# Patient Record
Sex: Male | Born: 1945 | Race: Black or African American | Hispanic: No | Marital: Married
Health system: Southern US, Community
[De-identification: ages and names within clinical notes are randomized; demographics above are authoritative.]

---

## 2009-01-20 ENCOUNTER — Encounter (INDEPENDENT_AMBULATORY_CARE_PROVIDER_SITE_OTHER): Payer: Self-pay | Admitting: Specialist

## 2009-01-20 ENCOUNTER — Ambulatory Visit (HOSPITAL_BASED_OUTPATIENT_CLINIC_OR_DEPARTMENT_OTHER): Admission: RE | Admit: 2009-01-20 | Discharge: 2009-01-20 | Payer: Self-pay | Admitting: Specialist

## 2009-07-14 ENCOUNTER — Ambulatory Visit (HOSPITAL_COMMUNITY): Admission: RE | Admit: 2009-07-14 | Discharge: 2009-07-14 | Payer: Self-pay | Admitting: Gastroenterology

## 2010-11-20 LAB — POCT HEMOGLOBIN-HEMACUE: Hemoglobin: 12.6 g/dL — ABNORMAL LOW (ref 13.0–17.0)

## 2010-12-26 NOTE — Op Note (Signed)
NAMEJERRET, Patrick Mann            ACCOUNT NO.:  [id number]   MEDICAL RECORD NO.:  [id number]          PATIENT TYPE:  AMB   LOCATION:  [facility]                         FACILITY:  [facility]   PHYSICIAN:  Jene Every, M.D.    DATE OF BIRTH:  05/07/1946   DATE OF PROCEDURE:  01/20/2009  DATE OF DISCHARGE:                               OPERATIVE REPORT   PREOPERATIVE DIAGNOSES:  Foreign body, right anterior knee.   POSTOPERATIVE DIAGNOSES:  Foreign body, right anterior knee,  hypertrophic bursa.   PROCEDURE PERFORMED:  1. Removal of foreign body, right knee.  2. Excision of prepatellar bursa.   ANESTHESIA:  General.   ASSISTANT:  None.   BRIEF HISTORY:  This 65 year old with retained foreign body of the right  knee, after falling on a mirror.  Had  pain, swelling, inability to  kneel.  X-rays indicated a foreign body in the pre-patellar area.  Is  indicated for excision.  Risks and benefits were discussed including  bleeding, infection, etc.   DESCRIPTION OF PROCEDURE:  The patient in the supine position, after  adequate general anesthesia and 1 gram of  Kefzol, the left lower  extremity was prepped and draped in the usual sterile fashion.  Under C-  arm x-ray, we were unable to see the foreign body on the mini C-arm..  It was palpable in the subcutaneous tissue anteriorly.  Incised the skin  longitudinally approximately 1.5 cm full-thickness, excising a small  lesion anteriorly that was identified as a probable area of the  subcutaneous foreign body.  Noted also there was a very hypertrophic  bursa that was incorporating this expected foreign body in the entire  prepatellar area of the knee.  I therefore meticulously performed a  bursectomy of the prepatellar bursa medial, lateral, cephalad and caudad  as an entire specimen.  It was then removed onto the back table and then  placed onto the mini C-arm and x-rayed,  and the foreign body was noted  to be within the confines of the  hypertrophic bursa.  Following this,  the wound was copiously irrigated, injected with 0.25% Marcaine with  epinephrine, and closed with #4-0 nylon simple sutures.  Radiographs  demonstrated no evidence of foreign body after that.  Exam under  anesthesia was unremarkable as well.   Next, the wound was dressed sterilely and secured with an Ace bandage.  There was no tourniquet. The patient was extubated without difficulty  and transported to recovery in satisfactory condition.   The patient tolerated the procedure well.  There were no complications.  No assistant.     Jene Every, M.D.  Electronically Signed    JB/MEDQ  D:  01/20/2009  T:  01/20/2009  Job:  [id number]

## 2019-09-04 ENCOUNTER — Ambulatory Visit: Payer: Self-pay | Attending: Internal Medicine

## 2019-09-04 DIAGNOSIS — Z23 Encounter for immunization: Secondary | ICD-10-CM | POA: Insufficient documentation

## 2019-09-04 NOTE — Progress Notes (Signed)
   Covid-19 Vaccination Clinic  Name:  Patrick Mann    MRN: [id number] DOB: 18-Dec-1945  09/04/2019  [name] was observed post Covid-19 immunization for 15 minutes without incidence. He was provided with Vaccine Information Sheet and instruction to access the V-Safe system.   [name] was instructed to call 911 with any severe reactions post vaccine: Marland Kitchen Difficulty breathing  . Swelling of your face and throat  . A fast heartbeat  . A bad rash all over your body  . Dizziness and weakness    Immunizations Administered    Name Date Dose VIS Date Route   Pfizer COVID-19 Vaccine 09/04/2019  6:04 PM 0.3 mL 07/24/2019 Intramuscular   Manufacturer: [employer], [employer]   Lot: [id number]   NDC: [address]-5

## 2019-09-22 ENCOUNTER — Ambulatory Visit: Payer: Self-pay | Attending: Internal Medicine

## 2019-09-22 DIAGNOSIS — Z23 Encounter for immunization: Secondary | ICD-10-CM | POA: Insufficient documentation

## 2019-09-22 NOTE — Progress Notes (Signed)
   Covid-19 Vaccination Clinic  Name:  Patrick Mann    MRN: [id number] DOB: 10/16/1945  09/22/2019  Mr. Rule was observed post Covid-19 immunization for 15 minutes without incidence. He was provided with Vaccine Information Sheet and instruction to access the V-Safe system.   [name] was instructed to call 911 with any severe reactions post vaccine: Marland Kitchen Difficulty breathing  . Swelling of your face and throat  . A fast heartbeat  . A bad rash all over your body  . Dizziness and weakness    Immunizations Administered    Name Date Dose VIS Date Route   Pfizer COVID-19 Vaccine 09/22/2019  9:20 AM 0.3 mL 07/24/2019 Intramuscular   Manufacturer: [employer], [employer]   Lot: [id number]   NDC: [address]-0

## 2019-10-07 ENCOUNTER — Ambulatory Visit: Payer: Self-pay

## 2020-10-18 DIAGNOSIS — M25551 Pain in right hip: Secondary | ICD-10-CM | POA: Diagnosis not present

## 2020-11-01 DIAGNOSIS — M79604 Pain in right leg: Secondary | ICD-10-CM | POA: Diagnosis not present

## 2020-11-09 ENCOUNTER — Other Ambulatory Visit: Payer: Self-pay | Admitting: Internal Medicine

## 2020-11-09 DIAGNOSIS — M79604 Pain in right leg: Secondary | ICD-10-CM

## 2020-11-11 ENCOUNTER — Other Ambulatory Visit: Payer: Self-pay

## 2020-11-11 ENCOUNTER — Ambulatory Visit
Admission: RE | Admit: 2020-11-11 | Discharge: 2020-11-11 | Disposition: A | Discharge status: Home / Self Care | Payer: Medicare Other | Source: Ambulatory Visit | Attending: Internal Medicine | Admitting: Internal Medicine

## 2020-11-11 DIAGNOSIS — M48061 Spinal stenosis, lumbar region without neurogenic claudication: Secondary | ICD-10-CM | POA: Diagnosis not present

## 2020-11-11 DIAGNOSIS — M545 Low back pain, unspecified: Secondary | ICD-10-CM | POA: Diagnosis not present

## 2020-11-11 DIAGNOSIS — M79604 Pain in right leg: Secondary | ICD-10-CM

## 2020-11-21 DIAGNOSIS — M48061 Spinal stenosis, lumbar region without neurogenic claudication: Secondary | ICD-10-CM | POA: Diagnosis not present

## 2020-11-21 DIAGNOSIS — M5416 Radiculopathy, lumbar region: Secondary | ICD-10-CM | POA: Diagnosis not present

## 2020-12-08 DIAGNOSIS — M5431 Sciatica, right side: Secondary | ICD-10-CM | POA: Diagnosis not present

## 2020-12-08 DIAGNOSIS — S91332A Puncture wound without foreign body, left foot, initial encounter: Secondary | ICD-10-CM | POA: Diagnosis not present

## 2020-12-08 DIAGNOSIS — E1169 Type 2 diabetes mellitus with other specified complication: Secondary | ICD-10-CM | POA: Diagnosis not present

## 2020-12-09 DIAGNOSIS — L03116 Cellulitis of left lower limb: Secondary | ICD-10-CM | POA: Diagnosis not present

## 2020-12-09 DIAGNOSIS — W450XXA Nail entering through skin, initial encounter: Secondary | ICD-10-CM | POA: Diagnosis not present

## 2020-12-09 DIAGNOSIS — S91332A Puncture wound without foreign body, left foot, initial encounter: Secondary | ICD-10-CM | POA: Diagnosis not present

## 2020-12-19 DIAGNOSIS — S91332D Puncture wound without foreign body, left foot, subsequent encounter: Secondary | ICD-10-CM | POA: Diagnosis not present

## 2020-12-22 DIAGNOSIS — M5416 Radiculopathy, lumbar region: Secondary | ICD-10-CM | POA: Diagnosis not present

## 2021-01-18 DIAGNOSIS — M48061 Spinal stenosis, lumbar region without neurogenic claudication: Secondary | ICD-10-CM | POA: Diagnosis not present

## 2021-01-18 DIAGNOSIS — M5416 Radiculopathy, lumbar region: Secondary | ICD-10-CM | POA: Diagnosis not present

## 2021-02-20 DIAGNOSIS — Z1211 Encounter for screening for malignant neoplasm of colon: Secondary | ICD-10-CM | POA: Diagnosis not present

## 2021-02-23 DIAGNOSIS — M5416 Radiculopathy, lumbar region: Secondary | ICD-10-CM | POA: Diagnosis not present

## 2021-03-06 DIAGNOSIS — M48061 Spinal stenosis, lumbar region without neurogenic claudication: Secondary | ICD-10-CM | POA: Diagnosis not present

## 2021-03-08 DIAGNOSIS — E1169 Type 2 diabetes mellitus with other specified complication: Secondary | ICD-10-CM | POA: Diagnosis not present

## 2021-03-17 DIAGNOSIS — M5126 Other intervertebral disc displacement, lumbar region: Secondary | ICD-10-CM | POA: Diagnosis not present

## 2021-05-31 DIAGNOSIS — Z23 Encounter for immunization: Secondary | ICD-10-CM | POA: Diagnosis not present

## 2021-06-05 DIAGNOSIS — E782 Mixed hyperlipidemia: Secondary | ICD-10-CM | POA: Diagnosis not present

## 2021-06-05 DIAGNOSIS — Z0000 Encounter for general adult medical examination without abnormal findings: Secondary | ICD-10-CM | POA: Diagnosis not present

## 2021-06-05 DIAGNOSIS — R030 Elevated blood-pressure reading, without diagnosis of hypertension: Secondary | ICD-10-CM | POA: Diagnosis not present

## 2021-06-05 DIAGNOSIS — Z23 Encounter for immunization: Secondary | ICD-10-CM | POA: Diagnosis not present

## 2021-06-05 DIAGNOSIS — M5136 Other intervertebral disc degeneration, lumbar region: Secondary | ICD-10-CM | POA: Diagnosis not present

## 2021-06-05 DIAGNOSIS — E1169 Type 2 diabetes mellitus with other specified complication: Secondary | ICD-10-CM | POA: Diagnosis not present

## 2021-07-03 DIAGNOSIS — E782 Mixed hyperlipidemia: Secondary | ICD-10-CM | POA: Diagnosis not present

## 2021-07-03 DIAGNOSIS — R030 Elevated blood-pressure reading, without diagnosis of hypertension: Secondary | ICD-10-CM | POA: Diagnosis not present

## 2021-07-03 DIAGNOSIS — E1169 Type 2 diabetes mellitus with other specified complication: Secondary | ICD-10-CM | POA: Diagnosis not present

## 2021-07-25 DIAGNOSIS — H4323 Crystalline deposits in vitreous body, bilateral: Secondary | ICD-10-CM | POA: Diagnosis not present

## 2021-07-25 DIAGNOSIS — H401131 Primary open-angle glaucoma, bilateral, mild stage: Secondary | ICD-10-CM | POA: Diagnosis not present

## 2021-07-25 DIAGNOSIS — H35033 Hypertensive retinopathy, bilateral: Secondary | ICD-10-CM | POA: Diagnosis not present

## 2021-08-01 DIAGNOSIS — E782 Mixed hyperlipidemia: Secondary | ICD-10-CM | POA: Diagnosis not present

## 2021-08-01 DIAGNOSIS — I10 Essential (primary) hypertension: Secondary | ICD-10-CM | POA: Diagnosis not present

## 2021-08-25 ENCOUNTER — Other Ambulatory Visit (HOSPITAL_COMMUNITY): Payer: Self-pay

## 2021-08-25 MED ORDER — VYZULTA 0.024 % OP SOLN
OPHTHALMIC | 3 refills | Status: AC
  Filled 2021-08-25: qty 5, 25d supply, fill #0
  Filled 2021-11-21: qty 5, 25d supply, fill #1
  Filled 2022-02-21: qty 5, 25d supply, fill #2
  Filled 2022-05-28: qty 5, 25d supply, fill #3

## 2021-08-26 ENCOUNTER — Other Ambulatory Visit (HOSPITAL_COMMUNITY): Payer: Self-pay

## 2021-08-28 ENCOUNTER — Other Ambulatory Visit (HOSPITAL_COMMUNITY): Payer: Self-pay

## 2021-08-31 ENCOUNTER — Other Ambulatory Visit (HOSPITAL_COMMUNITY): Payer: Self-pay

## 2021-11-21 ENCOUNTER — Other Ambulatory Visit (HOSPITAL_COMMUNITY): Payer: Self-pay

## 2021-11-22 ENCOUNTER — Other Ambulatory Visit (HOSPITAL_COMMUNITY): Payer: Self-pay

## 2021-12-20 DIAGNOSIS — E1169 Type 2 diabetes mellitus with other specified complication: Secondary | ICD-10-CM | POA: Diagnosis not present

## 2021-12-20 DIAGNOSIS — I10 Essential (primary) hypertension: Secondary | ICD-10-CM | POA: Diagnosis not present

## 2021-12-20 DIAGNOSIS — E782 Mixed hyperlipidemia: Secondary | ICD-10-CM | POA: Diagnosis not present

## 2022-02-21 ENCOUNTER — Other Ambulatory Visit (HOSPITAL_COMMUNITY): Payer: Self-pay

## 2022-02-22 ENCOUNTER — Other Ambulatory Visit (HOSPITAL_COMMUNITY): Payer: Self-pay

## 2022-05-28 ENCOUNTER — Other Ambulatory Visit (HOSPITAL_COMMUNITY): Payer: Self-pay

## 2022-05-29 ENCOUNTER — Other Ambulatory Visit (HOSPITAL_COMMUNITY): Payer: Self-pay

## 2022-06-06 DIAGNOSIS — M5136 Other intervertebral disc degeneration, lumbar region: Secondary | ICD-10-CM | POA: Diagnosis not present

## 2022-06-06 DIAGNOSIS — R030 Elevated blood-pressure reading, without diagnosis of hypertension: Secondary | ICD-10-CM | POA: Diagnosis not present

## 2022-06-06 DIAGNOSIS — M7710 Lateral epicondylitis, unspecified elbow: Secondary | ICD-10-CM | POA: Diagnosis not present

## 2022-06-06 DIAGNOSIS — Z0000 Encounter for general adult medical examination without abnormal findings: Secondary | ICD-10-CM | POA: Diagnosis not present

## 2022-06-06 DIAGNOSIS — Z1211 Encounter for screening for malignant neoplasm of colon: Secondary | ICD-10-CM | POA: Diagnosis not present

## 2022-06-06 DIAGNOSIS — Z23 Encounter for immunization: Secondary | ICD-10-CM | POA: Diagnosis not present

## 2022-06-06 DIAGNOSIS — E782 Mixed hyperlipidemia: Secondary | ICD-10-CM | POA: Diagnosis not present

## 2022-06-06 DIAGNOSIS — Z79899 Other long term (current) drug therapy: Secondary | ICD-10-CM | POA: Diagnosis not present

## 2022-06-06 DIAGNOSIS — E1169 Type 2 diabetes mellitus with other specified complication: Secondary | ICD-10-CM | POA: Diagnosis not present

## 2022-07-23 DIAGNOSIS — Z23 Encounter for immunization: Secondary | ICD-10-CM | POA: Diagnosis not present

## 2022-09-26 ENCOUNTER — Other Ambulatory Visit (HOSPITAL_COMMUNITY): Payer: Self-pay

## 2022-10-03 ENCOUNTER — Other Ambulatory Visit (HOSPITAL_COMMUNITY): Payer: Self-pay

## 2022-10-11 DIAGNOSIS — Z1211 Encounter for screening for malignant neoplasm of colon: Secondary | ICD-10-CM | POA: Diagnosis not present

## 2022-10-11 DIAGNOSIS — D120 Benign neoplasm of cecum: Secondary | ICD-10-CM | POA: Diagnosis not present

## 2022-10-11 DIAGNOSIS — K5730 Diverticulosis of large intestine without perforation or abscess without bleeding: Secondary | ICD-10-CM | POA: Diagnosis not present

## 2022-10-11 DIAGNOSIS — K648 Other hemorrhoids: Secondary | ICD-10-CM | POA: Diagnosis not present

## 2022-10-11 DIAGNOSIS — D125 Benign neoplasm of sigmoid colon: Secondary | ICD-10-CM | POA: Diagnosis not present

## 2022-10-11 DIAGNOSIS — D122 Benign neoplasm of ascending colon: Secondary | ICD-10-CM | POA: Diagnosis not present

## 2022-10-13 ENCOUNTER — Other Ambulatory Visit (HOSPITAL_COMMUNITY): Payer: Self-pay

## 2022-10-15 ENCOUNTER — Other Ambulatory Visit (HOSPITAL_COMMUNITY): Payer: Self-pay

## 2022-10-15 DIAGNOSIS — D125 Benign neoplasm of sigmoid colon: Secondary | ICD-10-CM | POA: Diagnosis not present

## 2022-10-15 DIAGNOSIS — D120 Benign neoplasm of cecum: Secondary | ICD-10-CM | POA: Diagnosis not present

## 2022-10-15 DIAGNOSIS — D122 Benign neoplasm of ascending colon: Secondary | ICD-10-CM | POA: Diagnosis not present

## 2022-10-15 MED ORDER — VYZULTA 0.024 % OP SOLN
OPHTHALMIC | 4 refills | Status: AC
  Filled 2022-10-15: qty 5, 50d supply, fill #0

## 2022-10-16 ENCOUNTER — Other Ambulatory Visit (HOSPITAL_COMMUNITY): Payer: Self-pay

## 2022-10-18 ENCOUNTER — Other Ambulatory Visit (HOSPITAL_COMMUNITY): Payer: Self-pay

## 2022-10-18 DIAGNOSIS — H401131 Primary open-angle glaucoma, bilateral, mild stage: Secondary | ICD-10-CM | POA: Diagnosis not present

## 2022-10-18 MED ORDER — VYZULTA 0.024 % OP SOLN
OPHTHALMIC | 4 refills | Status: AC
  Filled 2022-10-18: qty 5, 50d supply, fill #0
  Filled 2022-10-25: qty 2.5, 25d supply, fill #0

## 2022-10-25 ENCOUNTER — Other Ambulatory Visit (HOSPITAL_COMMUNITY): Payer: Self-pay

## 2022-10-26 ENCOUNTER — Other Ambulatory Visit (HOSPITAL_COMMUNITY): Payer: Self-pay

## 2022-11-03 IMAGING — MR MR LUMBAR SPINE W/O CM
4 series · 29 of 48 positions shown · non-contrast
Comparison: None.

CLINICAL DATA: Right thigh/leg pain, right leg numbness

EXAM:
MRI LUMBAR SPINE WITHOUT CONTRAST
TECHNIQUE: Multiplanar, multisequence MR imaging of the lumbar spine was
performed. No intravenous contrast was administered.

[Series 2: T2 · sagittal · 4.0mm · 0.53mm/px · 8 of 16 slices shown (1 of 2)]
[im 1/16]
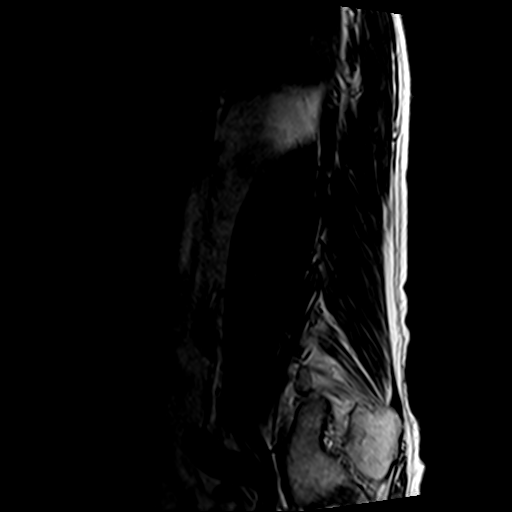
[im 3/16]
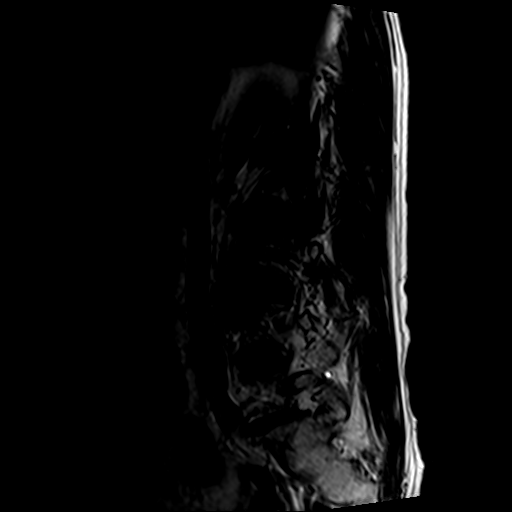
[im 5/16]
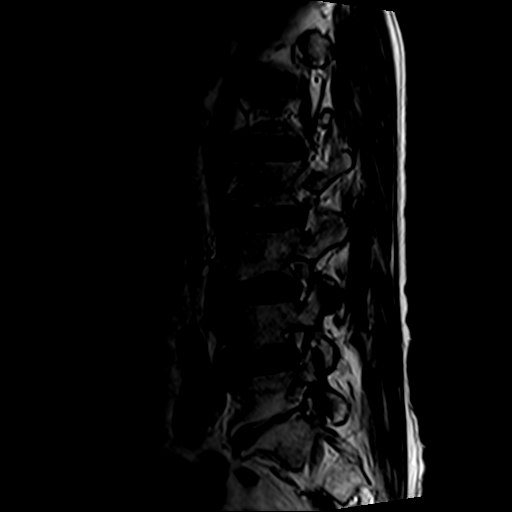
[im 7/16]
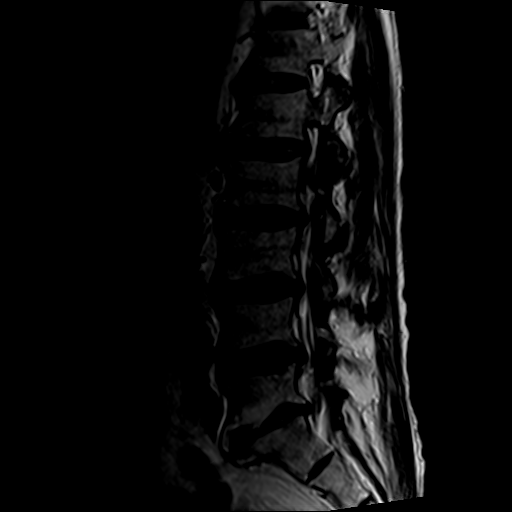
[im 9/16]
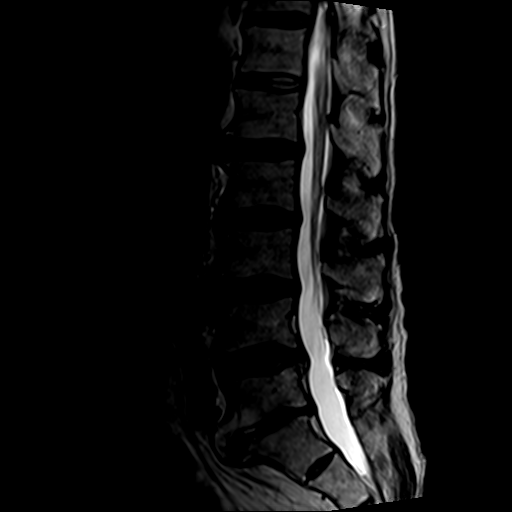
[im 11/16]
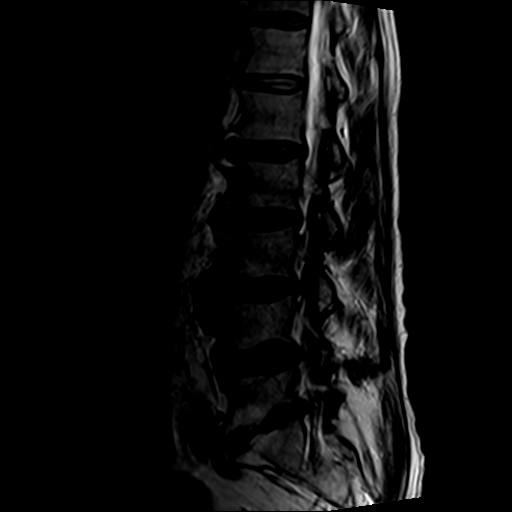
[im 13/16]
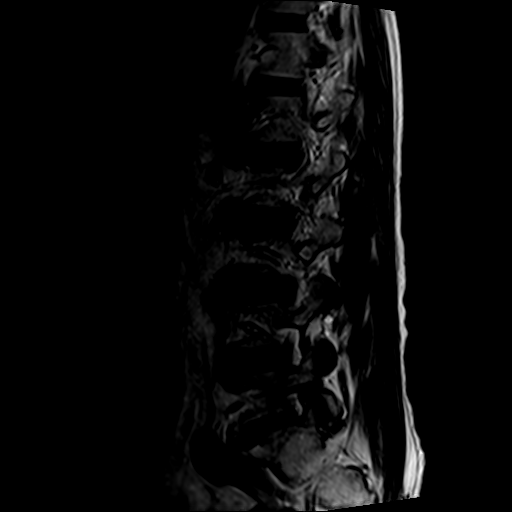
[im 16/16]
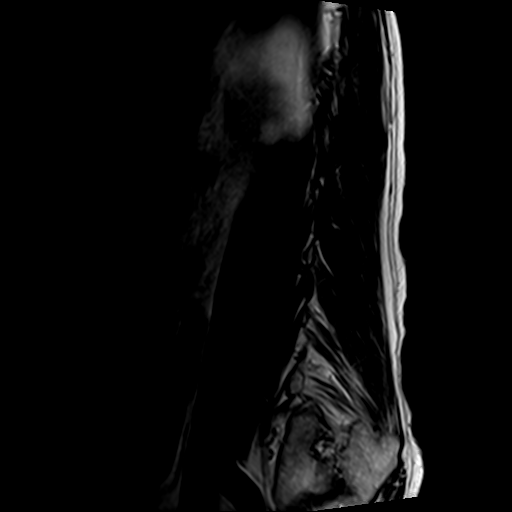

[Series 3: STIR · sagittal · 4.0mm · 1.05mm/px · 3 of 16 slices shown]
[im 2/16]
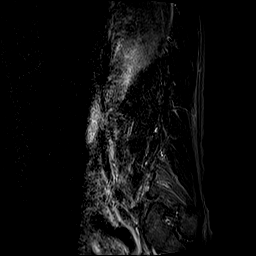
[im 8/16]
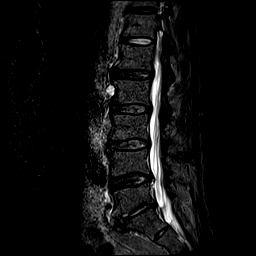
[im 14/16]
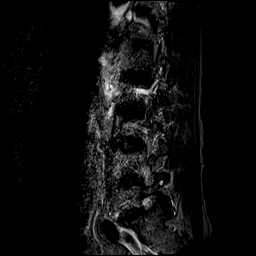

[Series 4: T1 · sagittal · 4.0mm · 0.53mm/px · 7 of 16 slices shown]
[im 1/16]
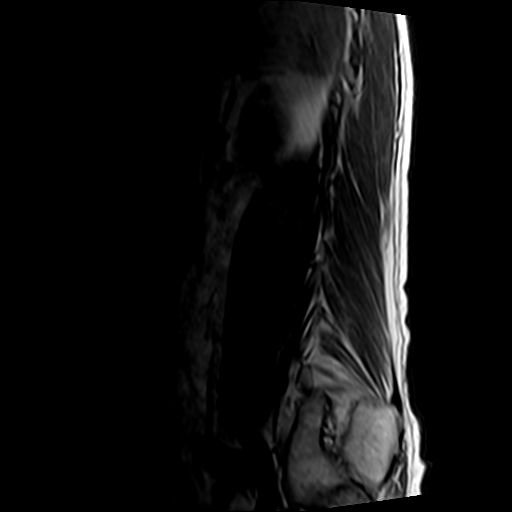
[im 2/16]
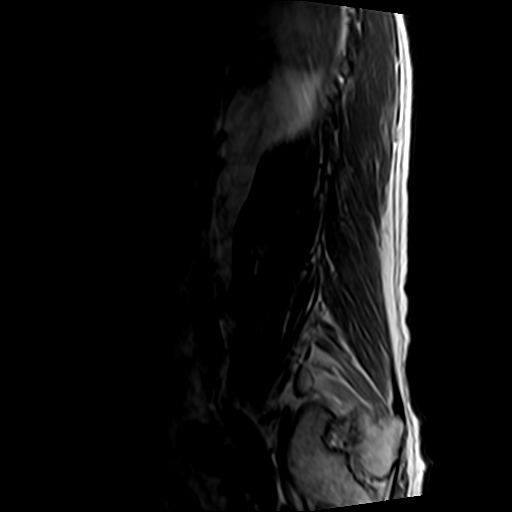
[im 4/16]
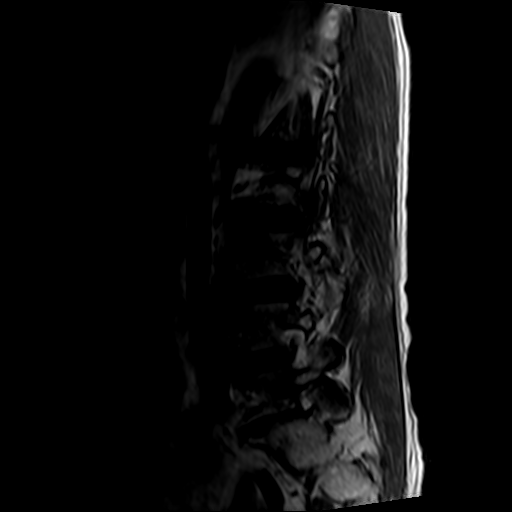
[im 6/16]
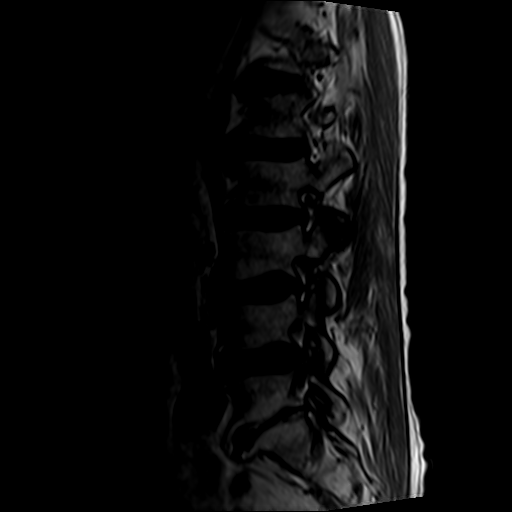
[im 8/16]
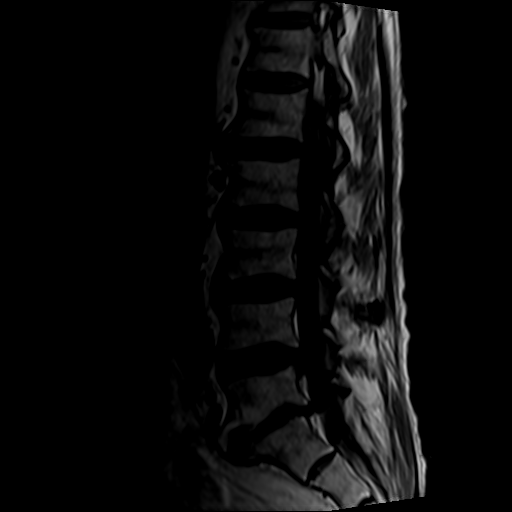
[im 10/16]
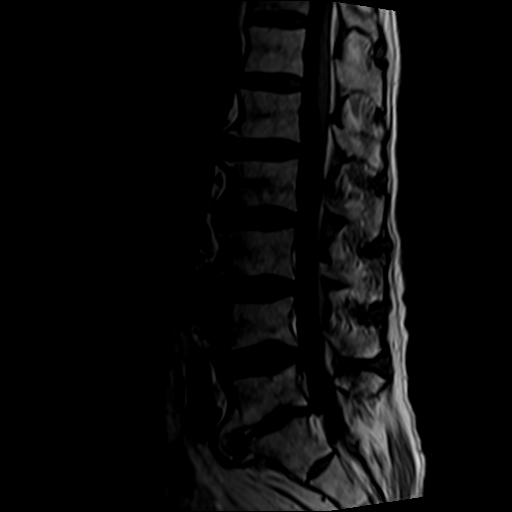
[im 14/16]
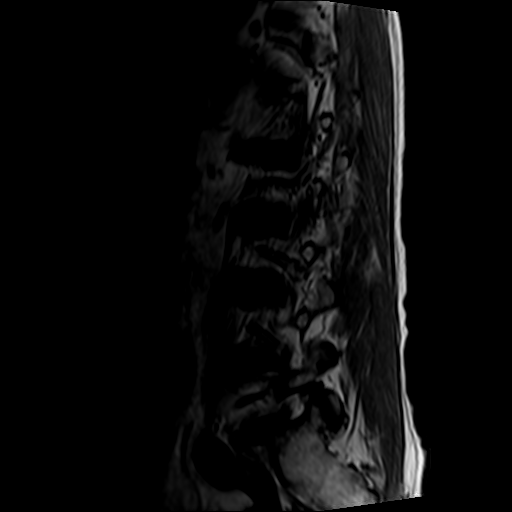

[Series 5: T2 · axial · 4.0mm · 0.70mm/px · z∈[-95,+104]mm · 11 of 41 slices shown (2 of 2)]
[im 2/41]
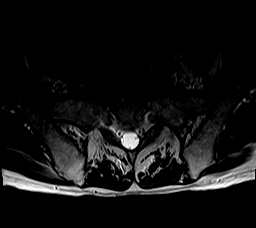
[im 6/41]
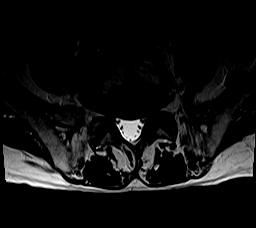
[im 8/41]
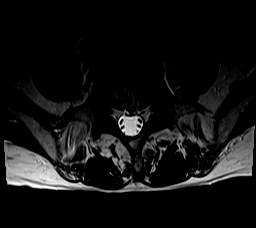
[im 12/41]
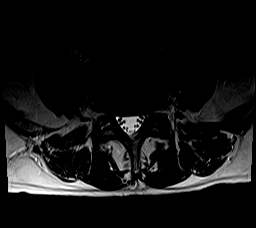
[im 18/41]
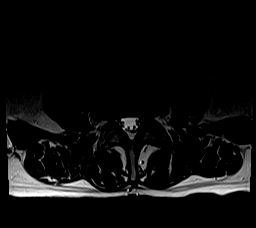
[im 21/41]
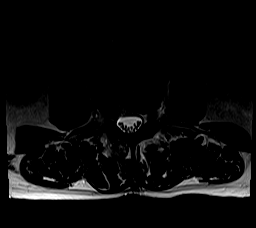
[im 23/41]
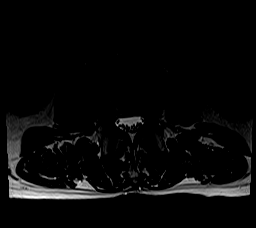
[im 29/41]
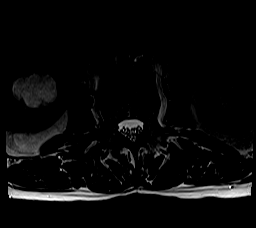
[im 33/41]
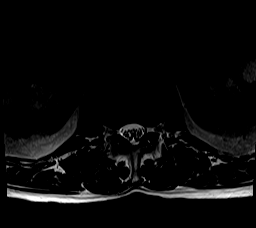
[im 35/41]
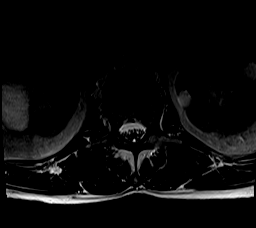
[im 39/41]
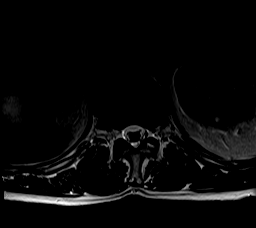

[29 of 48 positions shown; findings below may reference images not displayed]

FINDINGS: Segmentation:  Standard.

Alignment:  Trace retrolisthesis at L4-L5.

Vertebrae: Mild chronic loss of height of L5. No marrow edema. No
suspicious osseous lesion.

Conus medullaris and cauda equina: Conus extends to the L1 level.
Conus and cauda equina appear normal.

Paraspinal and other soft tissues: Bilateral renal T2 hyperintense
lesions statistically likely reflect cysts.

Disc levels:

L1-L2:  Disc bulge.  No canal or foraminal stenosis.

L2-L3: Minimal disc bulge. Mild facet arthropathy. Minor canal
stenosis. No left foraminal stenosis. Minor right foraminal
stenosis.

L3-L4: Minimal disc bulge. Mild facet arthropathy. Minor canal
stenosis. Minor foraminal stenosis.

L4-L5: Disc bulge with superimposed right central/subarticular disc
protrusion annular fissure. Mild facet arthropathy. No canal
stenosis. Narrowing of the right subarticular recess with potential
compression of traversing L5 nerve roots. Mild foraminal stenosis.

L5-S1: Disc bulge with endplate osteophytic ridging. Mild facet
arthropathy. No canal stenosis. Moderate right and mild to moderate
left foraminal stenosis.
IMPRESSION: Multilevel degenerative changes as detailed above. No high-grade
canal stenosis. There is narrowing of the right subarticular recess
at L4-L5 with potential L5 nerve root compression. Right foraminal
narrowing is greatest at L5-S1.

## 2022-11-21 DIAGNOSIS — S2341XA Sprain of ribs, initial encounter: Secondary | ICD-10-CM | POA: Diagnosis not present

## 2022-11-21 DIAGNOSIS — R0781 Pleurodynia: Secondary | ICD-10-CM | POA: Diagnosis not present

## 2022-12-05 ENCOUNTER — Other Ambulatory Visit: Payer: Self-pay | Admitting: Internal Medicine

## 2022-12-05 ENCOUNTER — Ambulatory Visit
Admission: RE | Admit: 2022-12-05 | Discharge: 2022-12-05 | Disposition: A | Discharge status: Home / Self Care | Payer: Medicare Other | Source: Ambulatory Visit | Attending: Internal Medicine | Admitting: Internal Medicine

## 2022-12-05 DIAGNOSIS — R079 Chest pain, unspecified: Secondary | ICD-10-CM | POA: Diagnosis not present

## 2022-12-05 DIAGNOSIS — R0781 Pleurodynia: Secondary | ICD-10-CM

## 2022-12-05 DIAGNOSIS — E1169 Type 2 diabetes mellitus with other specified complication: Secondary | ICD-10-CM | POA: Diagnosis not present

## 2022-12-05 DIAGNOSIS — E782 Mixed hyperlipidemia: Secondary | ICD-10-CM | POA: Diagnosis not present

## 2022-12-05 DIAGNOSIS — E119 Type 2 diabetes mellitus without complications: Secondary | ICD-10-CM | POA: Diagnosis not present

## 2023-01-02 ENCOUNTER — Other Ambulatory Visit: Payer: Self-pay | Admitting: Internal Medicine

## 2023-01-02 ENCOUNTER — Ambulatory Visit
Admission: RE | Admit: 2023-01-02 | Discharge: 2023-01-02 | Disposition: A | Discharge status: Home / Self Care | Payer: Medicare Other | Source: Ambulatory Visit | Attending: Internal Medicine | Admitting: Internal Medicine

## 2023-01-02 DIAGNOSIS — M549 Dorsalgia, unspecified: Secondary | ICD-10-CM

## 2023-01-02 DIAGNOSIS — M546 Pain in thoracic spine: Secondary | ICD-10-CM | POA: Diagnosis not present

## 2023-01-08 ENCOUNTER — Other Ambulatory Visit: Payer: Self-pay | Admitting: Internal Medicine

## 2023-01-08 DIAGNOSIS — R109 Unspecified abdominal pain: Secondary | ICD-10-CM

## 2023-01-08 DIAGNOSIS — M5134 Other intervertebral disc degeneration, thoracic region: Secondary | ICD-10-CM | POA: Diagnosis not present

## 2023-01-08 DIAGNOSIS — R229 Localized swelling, mass and lump, unspecified: Secondary | ICD-10-CM | POA: Diagnosis not present

## 2023-01-08 DIAGNOSIS — M5136 Other intervertebral disc degeneration, lumbar region: Secondary | ICD-10-CM | POA: Diagnosis not present

## 2023-01-09 ENCOUNTER — Ambulatory Visit
Admission: RE | Admit: 2023-01-09 | Discharge: 2023-01-09 | Disposition: A | Discharge status: Home / Self Care | Payer: Medicare Other | Source: Ambulatory Visit | Attending: Internal Medicine | Admitting: Internal Medicine

## 2023-01-09 DIAGNOSIS — K5900 Constipation, unspecified: Secondary | ICD-10-CM | POA: Diagnosis not present

## 2023-01-09 DIAGNOSIS — K5730 Diverticulosis of large intestine without perforation or abscess without bleeding: Secondary | ICD-10-CM | POA: Diagnosis not present

## 2023-01-09 DIAGNOSIS — R109 Unspecified abdominal pain: Secondary | ICD-10-CM

## 2023-01-22 ENCOUNTER — Other Ambulatory Visit (HOSPITAL_COMMUNITY): Payer: Self-pay

## 2023-01-22 DIAGNOSIS — H43813 Vitreous degeneration, bilateral: Secondary | ICD-10-CM | POA: Diagnosis not present

## 2023-01-22 DIAGNOSIS — H35033 Hypertensive retinopathy, bilateral: Secondary | ICD-10-CM | POA: Diagnosis not present

## 2023-01-22 DIAGNOSIS — H401131 Primary open-angle glaucoma, bilateral, mild stage: Secondary | ICD-10-CM | POA: Diagnosis not present

## 2023-01-22 MED ORDER — LATANOPROST 0.005 % OP SOLN
OPHTHALMIC | 3 refills | Status: AC
  Filled 2023-01-22: qty 7.5, 75d supply, fill #0

## 2023-01-29 DIAGNOSIS — R109 Unspecified abdominal pain: Secondary | ICD-10-CM | POA: Diagnosis not present

## 2023-03-26 DIAGNOSIS — H35033 Hypertensive retinopathy, bilateral: Secondary | ICD-10-CM | POA: Diagnosis not present

## 2023-03-26 DIAGNOSIS — H43813 Vitreous degeneration, bilateral: Secondary | ICD-10-CM | POA: Diagnosis not present

## 2023-03-26 DIAGNOSIS — H401131 Primary open-angle glaucoma, bilateral, mild stage: Secondary | ICD-10-CM | POA: Diagnosis not present

## 2023-05-10 ENCOUNTER — Other Ambulatory Visit (HOSPITAL_COMMUNITY): Payer: Self-pay

## 2023-05-10 DIAGNOSIS — H401131 Primary open-angle glaucoma, bilateral, mild stage: Secondary | ICD-10-CM | POA: Diagnosis not present

## 2023-05-10 MED ORDER — KETOROLAC TROMETHAMINE 0.5 % OP SOLN
1.0000 [drp] | Freq: Four times a day (QID) | OPHTHALMIC | 0 refills | Status: AC
  Filled 2023-05-10: qty 5, 25d supply, fill #0

## 2023-05-29 DIAGNOSIS — H35033 Hypertensive retinopathy, bilateral: Secondary | ICD-10-CM | POA: Diagnosis not present

## 2023-05-29 DIAGNOSIS — H43813 Vitreous degeneration, bilateral: Secondary | ICD-10-CM | POA: Diagnosis not present

## 2023-05-29 DIAGNOSIS — H401131 Primary open-angle glaucoma, bilateral, mild stage: Secondary | ICD-10-CM | POA: Diagnosis not present

## 2023-06-07 ENCOUNTER — Other Ambulatory Visit (HOSPITAL_COMMUNITY): Payer: Self-pay

## 2023-06-07 DIAGNOSIS — H401131 Primary open-angle glaucoma, bilateral, mild stage: Secondary | ICD-10-CM | POA: Diagnosis not present

## 2023-06-07 MED ORDER — KETOROLAC TROMETHAMINE 0.4 % OP SOLN
OPHTHALMIC | 0 refills | Status: AC
  Filled 2023-06-07: qty 5, 10d supply, fill #0

## 2023-06-10 ENCOUNTER — Other Ambulatory Visit (HOSPITAL_COMMUNITY): Payer: Self-pay

## 2023-06-10 DIAGNOSIS — Z9181 History of falling: Secondary | ICD-10-CM | POA: Diagnosis not present

## 2023-06-10 DIAGNOSIS — D649 Anemia, unspecified: Secondary | ICD-10-CM | POA: Diagnosis not present

## 2023-06-10 DIAGNOSIS — M5136 Other intervertebral disc degeneration, lumbar region with discogenic back pain only: Secondary | ICD-10-CM | POA: Diagnosis not present

## 2023-06-10 DIAGNOSIS — E1169 Type 2 diabetes mellitus with other specified complication: Secondary | ICD-10-CM | POA: Diagnosis not present

## 2023-06-10 DIAGNOSIS — Z23 Encounter for immunization: Secondary | ICD-10-CM | POA: Diagnosis not present

## 2023-06-10 DIAGNOSIS — E119 Type 2 diabetes mellitus without complications: Secondary | ICD-10-CM | POA: Diagnosis not present

## 2023-06-10 DIAGNOSIS — R350 Frequency of micturition: Secondary | ICD-10-CM | POA: Diagnosis not present

## 2023-06-10 DIAGNOSIS — Z0000 Encounter for general adult medical examination without abnormal findings: Secondary | ICD-10-CM | POA: Diagnosis not present

## 2023-06-10 DIAGNOSIS — E782 Mixed hyperlipidemia: Secondary | ICD-10-CM | POA: Diagnosis not present

## 2023-06-10 DIAGNOSIS — Z79899 Other long term (current) drug therapy: Secondary | ICD-10-CM | POA: Diagnosis not present

## 2023-06-11 ENCOUNTER — Other Ambulatory Visit (HOSPITAL_COMMUNITY): Payer: Self-pay

## 2023-06-21 ENCOUNTER — Other Ambulatory Visit (HOSPITAL_COMMUNITY): Payer: Self-pay

## 2023-06-25 ENCOUNTER — Other Ambulatory Visit (HOSPITAL_COMMUNITY): Payer: Self-pay

## 2023-06-25 DIAGNOSIS — H43813 Vitreous degeneration, bilateral: Secondary | ICD-10-CM | POA: Diagnosis not present

## 2023-06-25 DIAGNOSIS — H401131 Primary open-angle glaucoma, bilateral, mild stage: Secondary | ICD-10-CM | POA: Diagnosis not present

## 2023-06-25 DIAGNOSIS — H35033 Hypertensive retinopathy, bilateral: Secondary | ICD-10-CM | POA: Diagnosis not present

## 2023-06-25 MED ORDER — BACITRACIN 500 UNIT/GM OP OINT
TOPICAL_OINTMENT | OPHTHALMIC | 0 refills | Status: AC
  Filled 2023-06-25: qty 3.5, 30d supply, fill #0

## 2023-06-27 ENCOUNTER — Other Ambulatory Visit (HOSPITAL_COMMUNITY): Payer: Self-pay

## 2023-07-23 DIAGNOSIS — H35033 Hypertensive retinopathy, bilateral: Secondary | ICD-10-CM | POA: Diagnosis not present

## 2023-07-23 DIAGNOSIS — H43813 Vitreous degeneration, bilateral: Secondary | ICD-10-CM | POA: Diagnosis not present

## 2023-07-23 DIAGNOSIS — H401131 Primary open-angle glaucoma, bilateral, mild stage: Secondary | ICD-10-CM | POA: Diagnosis not present

## 2023-09-13 DIAGNOSIS — D649 Anemia, unspecified: Secondary | ICD-10-CM | POA: Diagnosis not present

## 2023-11-14 ENCOUNTER — Other Ambulatory Visit (HOSPITAL_COMMUNITY): Payer: Self-pay

## 2023-11-14 DIAGNOSIS — H35033 Hypertensive retinopathy, bilateral: Secondary | ICD-10-CM | POA: Diagnosis not present

## 2023-11-14 DIAGNOSIS — H401131 Primary open-angle glaucoma, bilateral, mild stage: Secondary | ICD-10-CM | POA: Diagnosis not present

## 2023-11-14 DIAGNOSIS — H43813 Vitreous degeneration, bilateral: Secondary | ICD-10-CM | POA: Diagnosis not present

## 2023-11-14 MED ORDER — ERYTHROMYCIN 5 MG/GM OP OINT
TOPICAL_OINTMENT | OPHTHALMIC | 0 refills | Status: AC
  Filled 2023-11-14: qty 3.5, 30d supply, fill #0

## 2023-11-20 ENCOUNTER — Other Ambulatory Visit (HOSPITAL_COMMUNITY): Payer: Self-pay

## 2023-12-10 DIAGNOSIS — E1169 Type 2 diabetes mellitus with other specified complication: Secondary | ICD-10-CM | POA: Diagnosis not present

## 2023-12-10 DIAGNOSIS — E782 Mixed hyperlipidemia: Secondary | ICD-10-CM | POA: Diagnosis not present

## 2024-01-08 DIAGNOSIS — R52 Pain, unspecified: Secondary | ICD-10-CM | POA: Diagnosis not present

## 2024-02-06 ENCOUNTER — Other Ambulatory Visit: Payer: Self-pay | Admitting: Internal Medicine

## 2024-02-06 DIAGNOSIS — R109 Unspecified abdominal pain: Secondary | ICD-10-CM

## 2024-02-10 DIAGNOSIS — E1169 Type 2 diabetes mellitus with other specified complication: Secondary | ICD-10-CM | POA: Diagnosis not present

## 2024-02-10 DIAGNOSIS — E782 Mixed hyperlipidemia: Secondary | ICD-10-CM | POA: Diagnosis not present

## 2024-02-19 ENCOUNTER — Ambulatory Visit
Admission: RE | Admit: 2024-02-19 | Discharge: 2024-02-19 | Disposition: A | Discharge status: Home / Self Care | Source: Ambulatory Visit | Attending: Internal Medicine | Admitting: Internal Medicine

## 2024-02-19 DIAGNOSIS — R109 Unspecified abdominal pain: Secondary | ICD-10-CM

## 2024-02-19 DIAGNOSIS — K5730 Diverticulosis of large intestine without perforation or abscess without bleeding: Secondary | ICD-10-CM | POA: Diagnosis not present

## 2024-02-19 MED ORDER — IOPAMIDOL (ISOVUE-370) INJECTION 76%
75.0000 mL | Freq: Once | INTRAVENOUS | Status: AC | PRN
  Administered 2024-02-19: 75 mL via INTRAVENOUS

## 2024-03-02 DIAGNOSIS — M799 Soft tissue disorder, unspecified: Secondary | ICD-10-CM | POA: Diagnosis not present

## 2024-03-12 DIAGNOSIS — E782 Mixed hyperlipidemia: Secondary | ICD-10-CM | POA: Diagnosis not present

## 2024-03-12 DIAGNOSIS — E1169 Type 2 diabetes mellitus with other specified complication: Secondary | ICD-10-CM | POA: Diagnosis not present

## 2024-03-27 DIAGNOSIS — M799 Soft tissue disorder, unspecified: Secondary | ICD-10-CM | POA: Diagnosis not present

## 2024-03-27 DIAGNOSIS — R1900 Intra-abdominal and pelvic swelling, mass and lump, unspecified site: Secondary | ICD-10-CM | POA: Diagnosis not present

## 2024-04-12 DIAGNOSIS — E1169 Type 2 diabetes mellitus with other specified complication: Secondary | ICD-10-CM | POA: Diagnosis not present

## 2024-04-12 DIAGNOSIS — E782 Mixed hyperlipidemia: Secondary | ICD-10-CM | POA: Diagnosis not present

## 2024-04-16 DIAGNOSIS — H401131 Primary open-angle glaucoma, bilateral, mild stage: Secondary | ICD-10-CM | POA: Diagnosis not present

## 2024-04-16 DIAGNOSIS — H43813 Vitreous degeneration, bilateral: Secondary | ICD-10-CM | POA: Diagnosis not present

## 2024-04-16 DIAGNOSIS — H35033 Hypertensive retinopathy, bilateral: Secondary | ICD-10-CM | POA: Diagnosis not present

## 2024-05-12 DIAGNOSIS — E1169 Type 2 diabetes mellitus with other specified complication: Secondary | ICD-10-CM | POA: Diagnosis not present

## 2024-05-12 DIAGNOSIS — E782 Mixed hyperlipidemia: Secondary | ICD-10-CM | POA: Diagnosis not present

## 2024-05-14 DIAGNOSIS — D485 Neoplasm of uncertain behavior of skin: Secondary | ICD-10-CM | POA: Diagnosis not present

## 2024-06-01 NOTE — Progress Notes (Signed)
 Kirubel Aja                                          MRN: [id number]   06/01/2024   The [facility] Quality Team Specialist reviewed this patient medical record for the purposes of chart review for care gap closure. The following were reviewed: chart review for care gap closure-kidney health evaluation for diabetes:uACR, EGFR.    [facility] Quality Team

## 2024-12-10 ENCOUNTER — Ambulatory Visit: Admitting: Dermatology
